# Patient Record
Sex: Male | Born: 1945 | Race: White | Hispanic: No | Marital: Married | Smoking: Former smoker
Health system: Southern US, Community
[De-identification: ages and names within clinical notes are randomized; demographics above are authoritative.]

## PROBLEM LIST (undated history)

## (undated) DIAGNOSIS — M109 Gout, unspecified: Secondary | ICD-10-CM

## (undated) DIAGNOSIS — E663 Overweight: Secondary | ICD-10-CM

## (undated) DIAGNOSIS — N289 Disorder of kidney and ureter, unspecified: Secondary | ICD-10-CM

## (undated) DIAGNOSIS — M549 Dorsalgia, unspecified: Secondary | ICD-10-CM

## (undated) DIAGNOSIS — N183 Chronic kidney disease, stage 3 unspecified: Secondary | ICD-10-CM

## (undated) DIAGNOSIS — I10 Essential (primary) hypertension: Secondary | ICD-10-CM

## (undated) DIAGNOSIS — G4733 Obstructive sleep apnea (adult) (pediatric): Secondary | ICD-10-CM

## (undated) DIAGNOSIS — Z87442 Personal history of urinary calculi: Secondary | ICD-10-CM

## (undated) DIAGNOSIS — E785 Hyperlipidemia, unspecified: Secondary | ICD-10-CM

## (undated) DIAGNOSIS — R7301 Impaired fasting glucose: Secondary | ICD-10-CM

## (undated) HISTORY — DX: Dorsalgia, unspecified: M54.9

## (undated) HISTORY — DX: Obstructive sleep apnea (adult) (pediatric): G47.33

## (undated) HISTORY — DX: Essential (primary) hypertension: I10

## (undated) HISTORY — DX: Impaired fasting glucose: R73.01

## (undated) HISTORY — PX: TONSILECTOMY, ADENOIDECTOMY, BILATERAL MYRINGOTOMY AND TUBES: SHX2538

## (undated) HISTORY — DX: Gout, unspecified: M10.9

## (undated) HISTORY — PX: TOE SURGERY: SHX1073

## (undated) HISTORY — DX: Chronic kidney disease, stage 3 unspecified: N18.30

## (undated) HISTORY — PX: OTHER SURGICAL HISTORY: SHX169

## (undated) HISTORY — DX: Overweight: E66.3

## (undated) HISTORY — DX: Chronic kidney disease, stage 3 (moderate): N18.3

## (undated) HISTORY — DX: Disorder of kidney and ureter, unspecified: N28.9

## (undated) HISTORY — DX: Hyperlipidemia, unspecified: E78.5

---

## 2005-09-27 ENCOUNTER — Inpatient Hospital Stay (HOSPITAL_COMMUNITY): Admission: RE | Admit: 2005-09-27 | Discharge: 2005-09-29 | Payer: Self-pay | Admitting: Urology

## 2005-10-04 ENCOUNTER — Ambulatory Visit: Admission: RE | Admit: 2005-10-04 | Discharge: 2005-10-04 | Payer: Self-pay | Admitting: Urology

## 2007-02-23 IMAGING — RF DG ABDOMEN 1V
1 series · 7 of 7 positions shown · non-contrast
Comparison: none

CLINICAL DATA: Right staghorn calculus. 
 ABDOMEN ? 1 VIEW:

[Series 1: run · 7 of 7 slices shown]
[im 1/7]
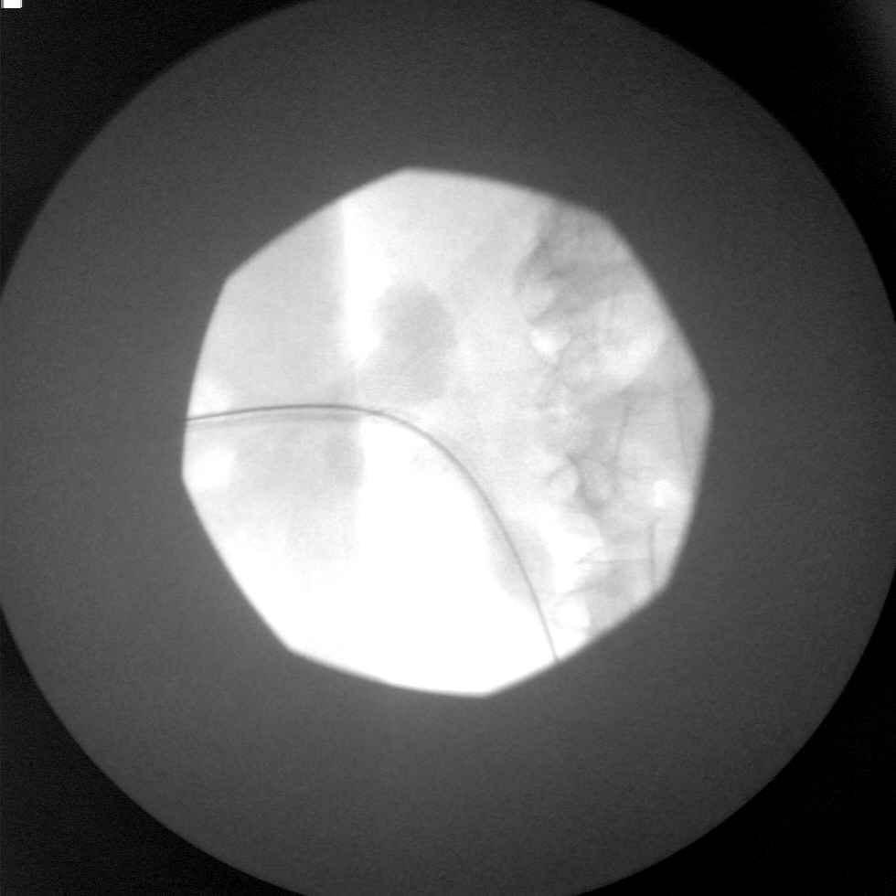
[im 2/7]
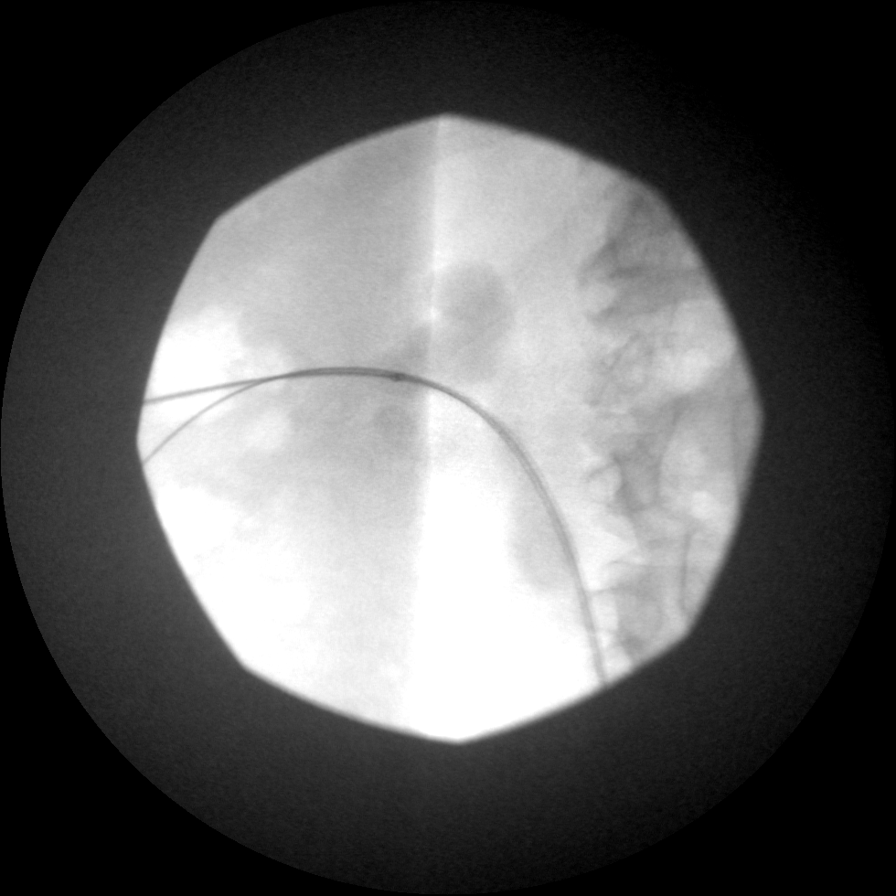
[im 3/7]
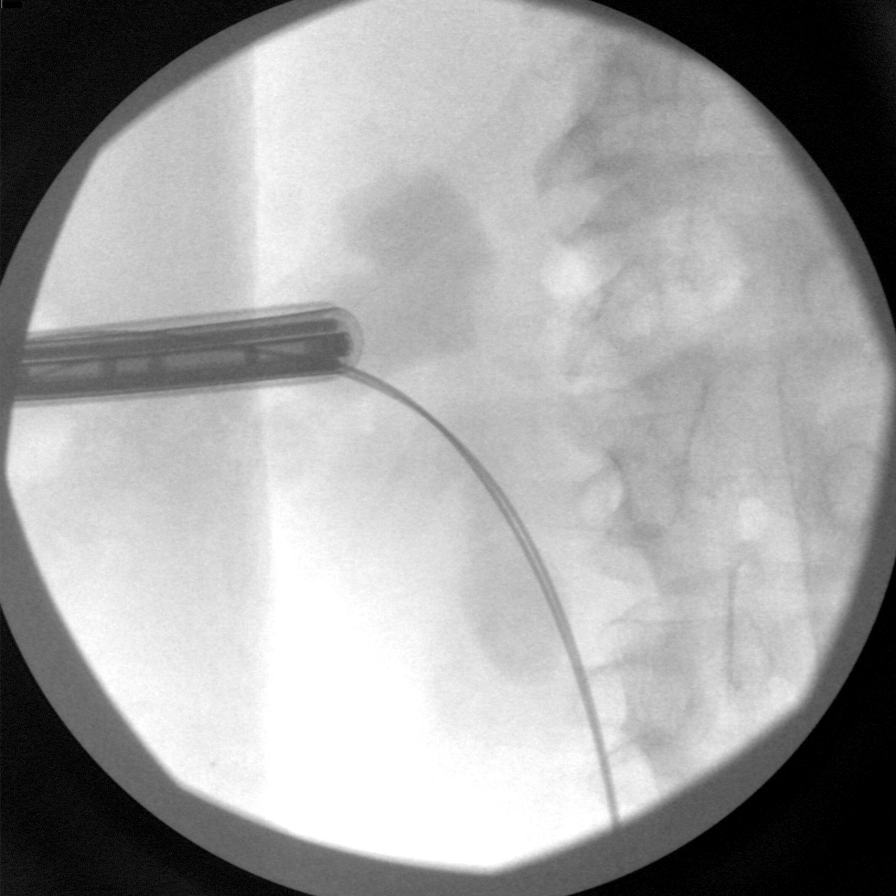
[im 4/7]
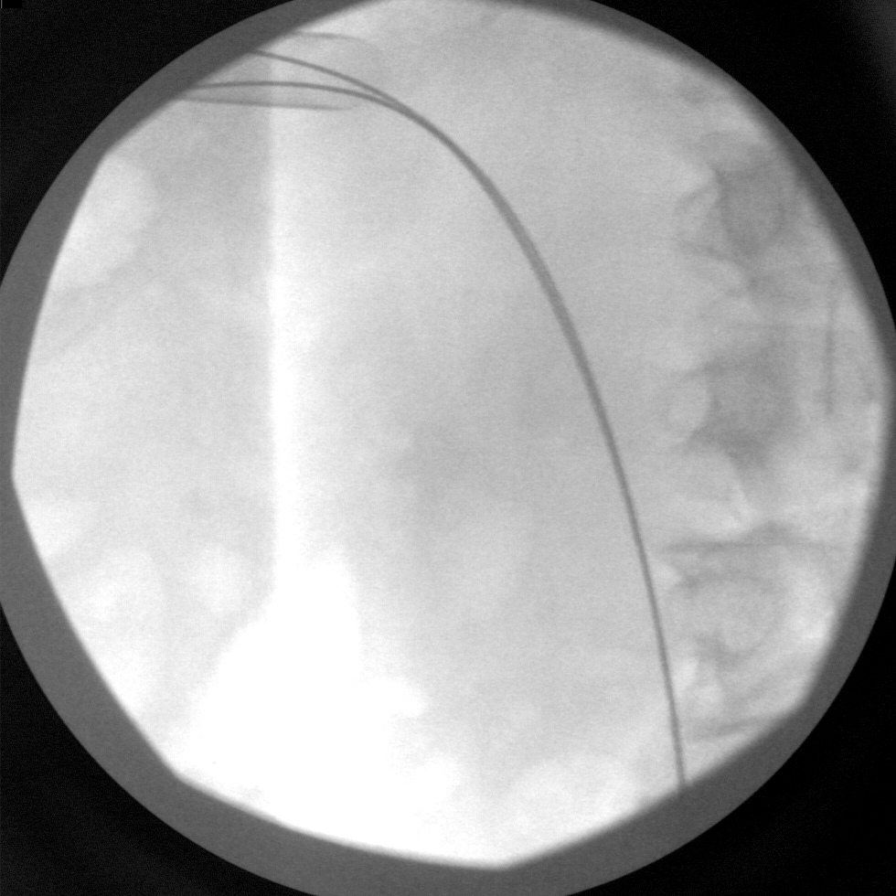
[im 5/7]
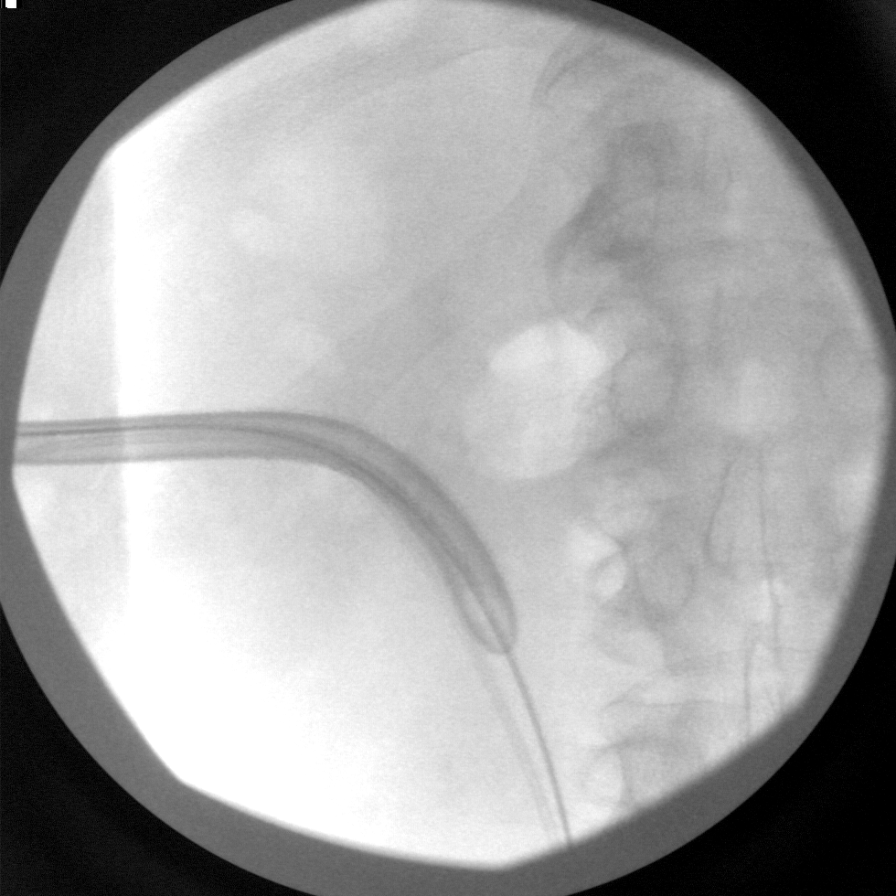
[im 6/7]
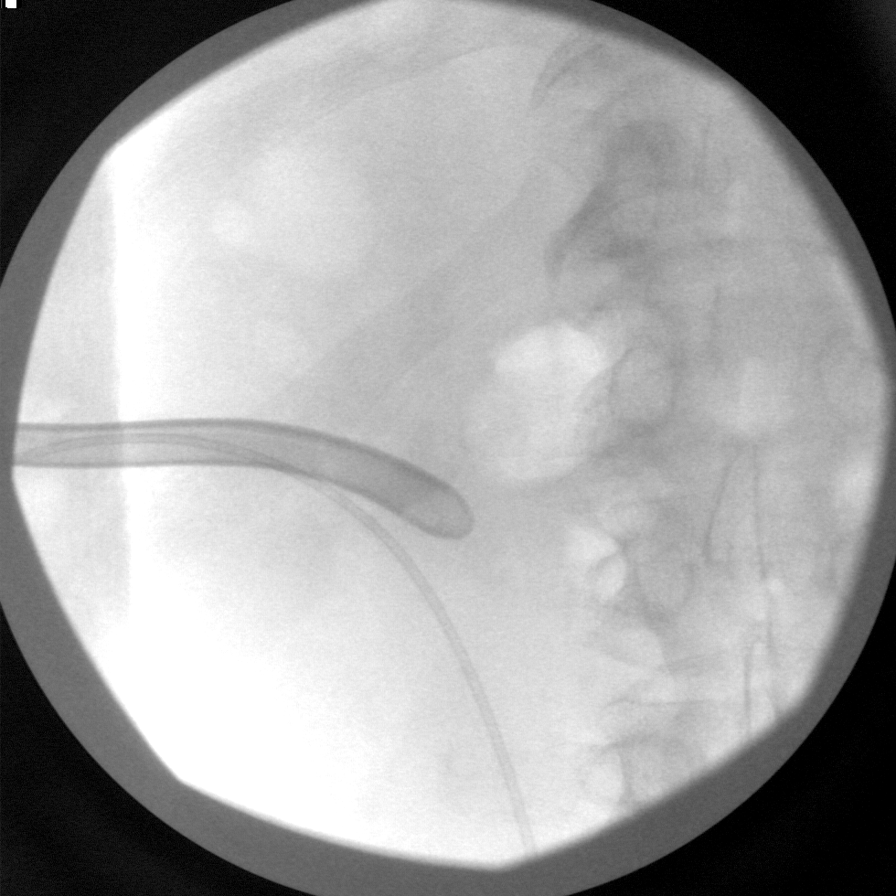
[im 7/7]
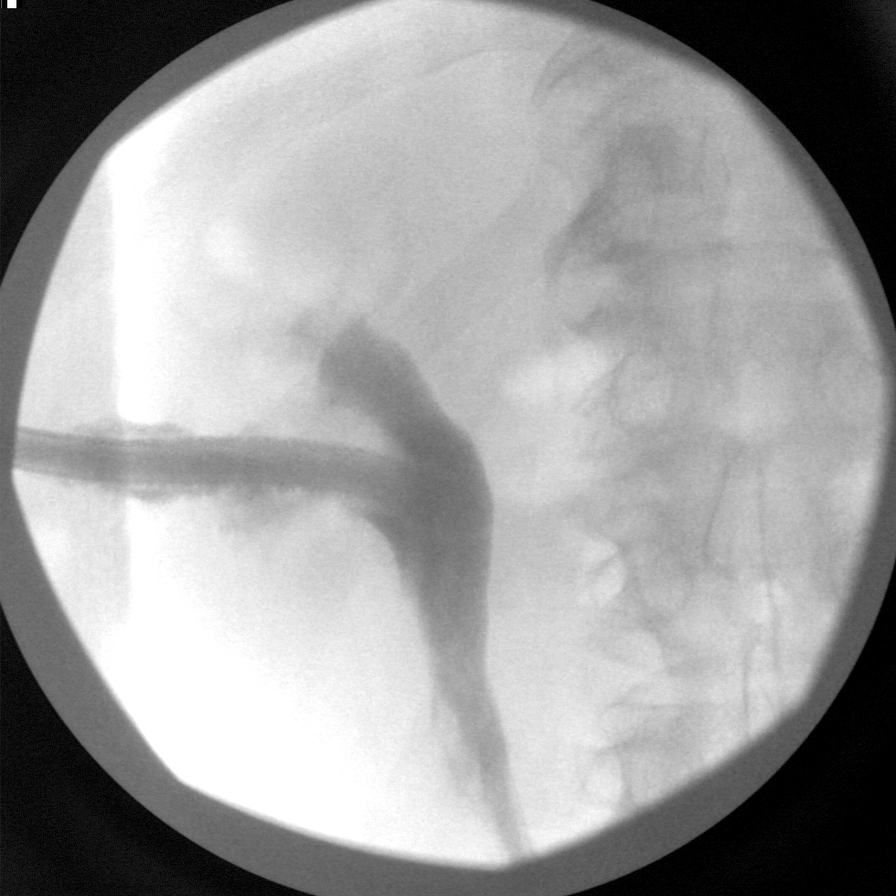

[7 of 7 positions shown; findings below may reference images not displayed]

FINDINGS: A single spot film over the right kidney shows a right percutaneous stent present overlying the previously demonstrated right renal staghorn calculus.
IMPRESSION: See above discussion.

## 2007-02-24 IMAGING — CT CT ABDOMEN W/O CM
2 of 3 series · 16 of 46 positions shown, 18 images · non-contrast
Comparison: none

CLINICAL DATA: Right staghorn calculus, blood in the urine, right renal drain, status post percutaneous nephrolithotomy.
 ABDOMEN CT WITHOUT CONTRAST ? 09/28/05:
TECHNIQUE: Multidetector CT imaging of the abdomen was performed following the standard protocol without IV contrast. 
 Comparing to percutaneous nephrolithotomy images of 09/27/05.
TECHNIQUE: Multidetector CT imaging of the pelvis was performed following the standard protocol without IV contrast.

[Series 2: stone_wo 5.0 b40f st · axial · 0.71mm/px · z∈[-400,-44]mm · 13 of 103 slices shown, 15 images]
[im 7/103  soft-tissue]
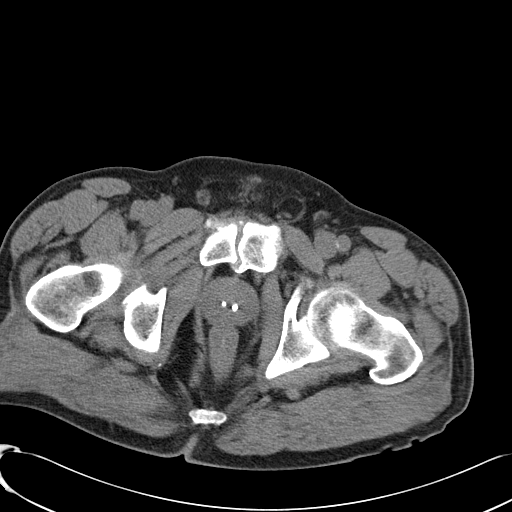
[im 7/103  bone]
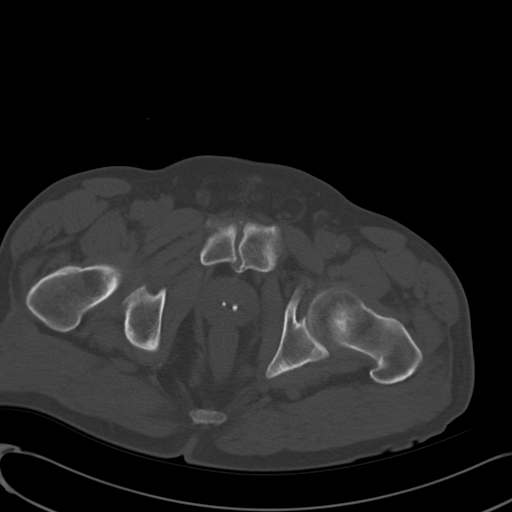
[im 14/103  soft-tissue]
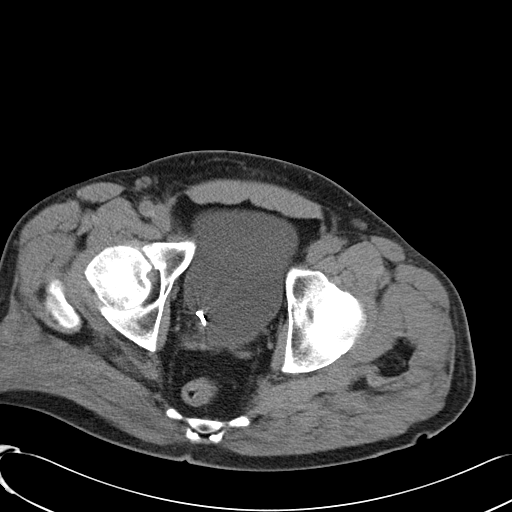
[im 20/103  soft-tissue]
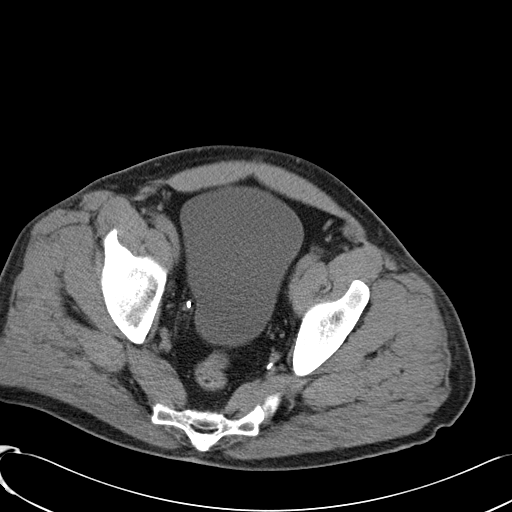
[im 30/103  soft-tissue]
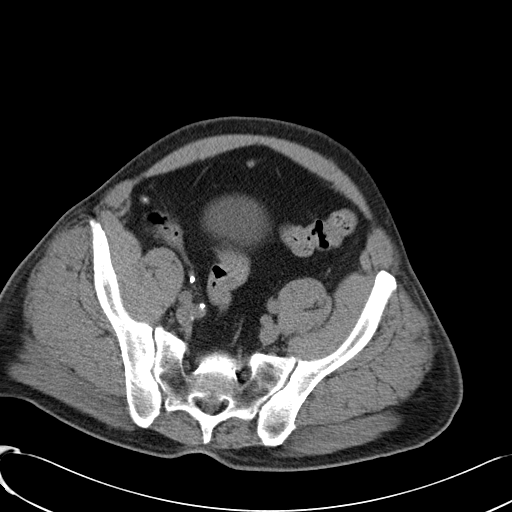
[im 37/103  soft-tissue]
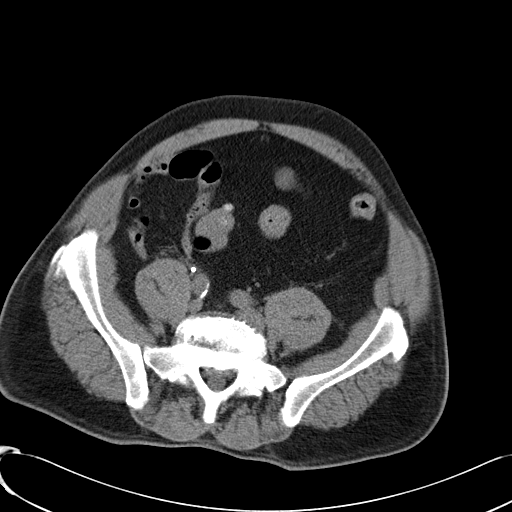
[im 43/103  soft-tissue]
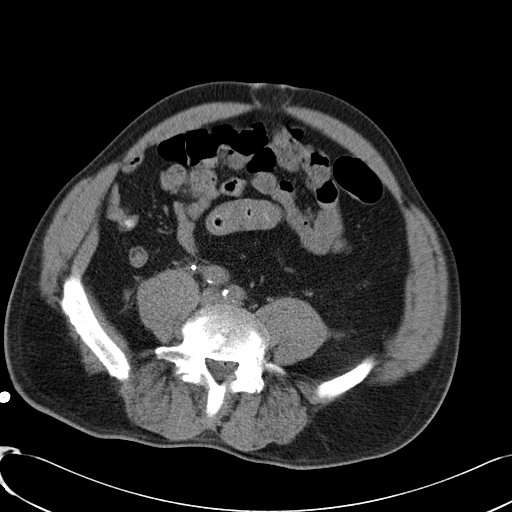
[im 53/103  soft-tissue]
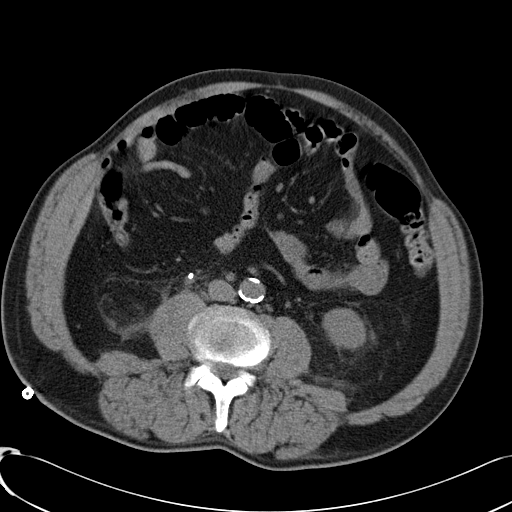
[im 60/103  soft-tissue]
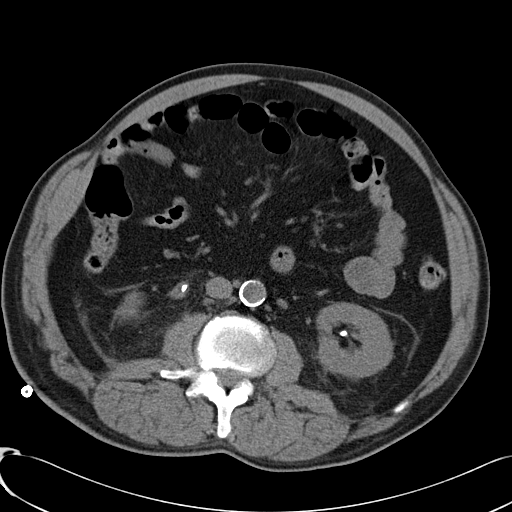
[im 66/103  soft-tissue]
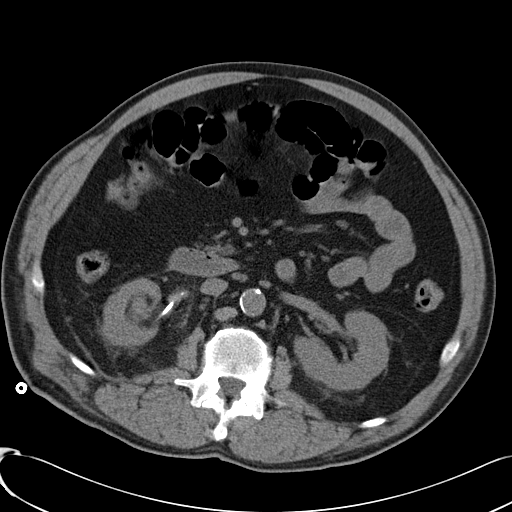
[im 66/103  bone]
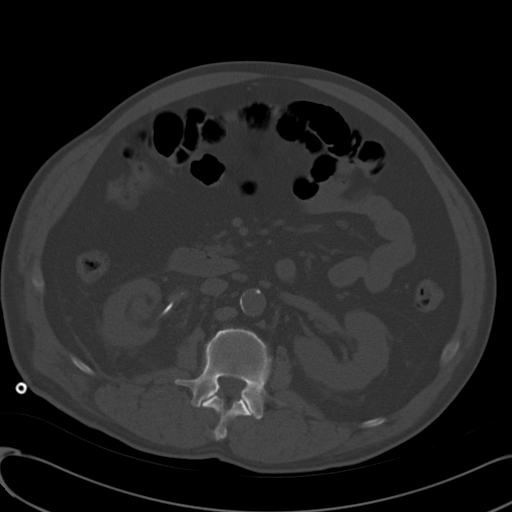
[im 73/103  soft-tissue]
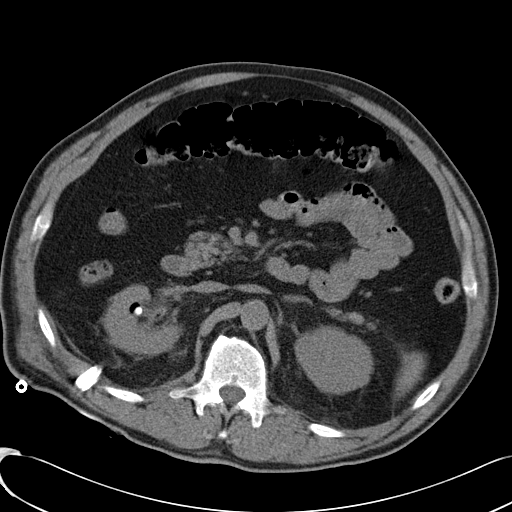
[im 83/103  soft-tissue]
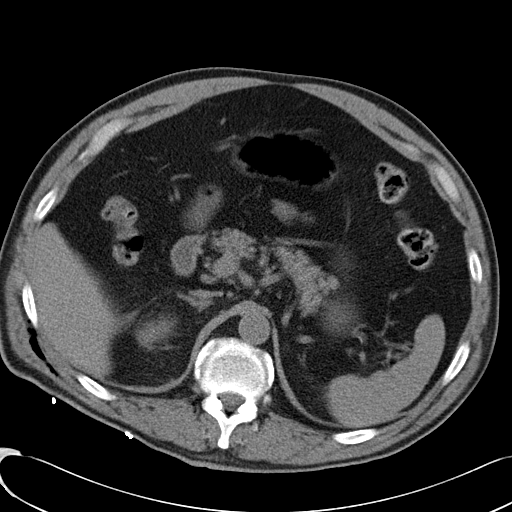
[im 89/103  soft-tissue]
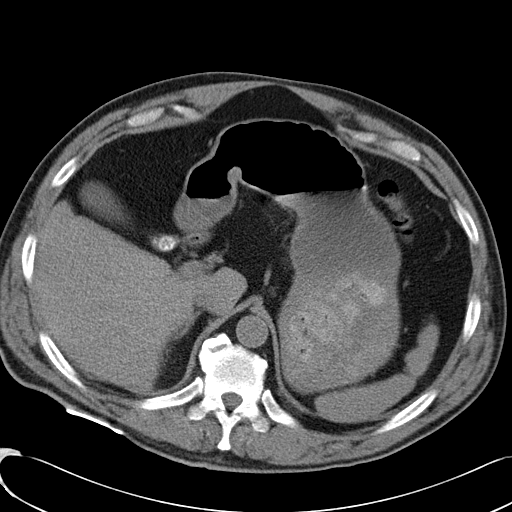
[im 96/103  soft-tissue]
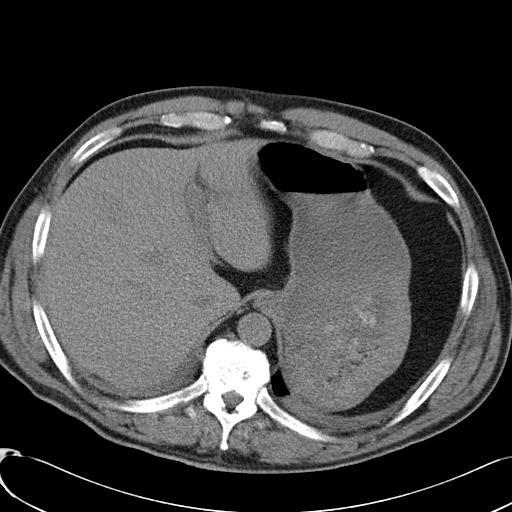

[Series 602: coronal · coronal · 0.83mm/px · 3 of 39 slices shown]
[im 13/39  soft-tissue]
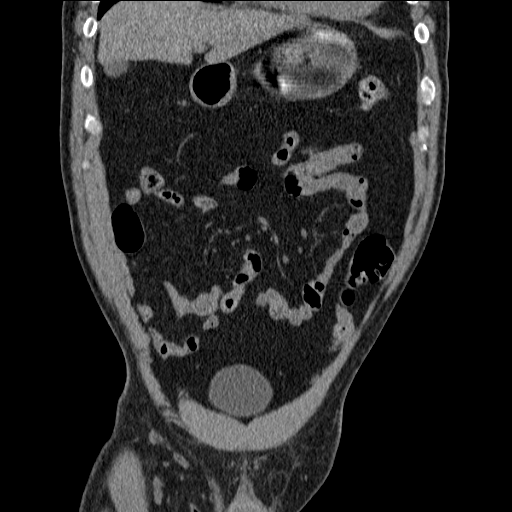
[im 17/39  soft-tissue]
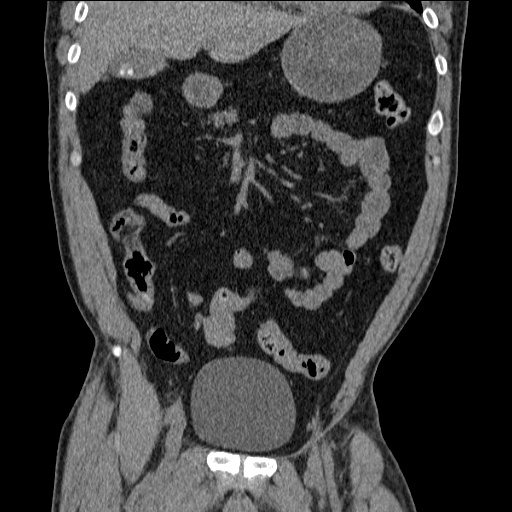
[im 22/39  soft-tissue]
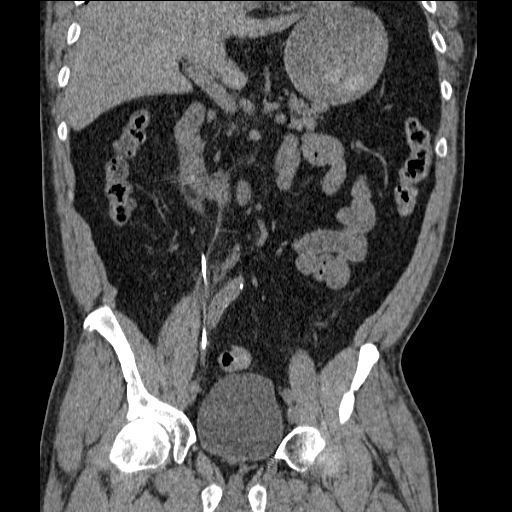

[16 of 46 positions shown; findings below may reference images not displayed]

FINDINGS: There is atelectasis at both lung bases along with a small right pleural effusion.  
 Multiple gallstones are present in the gallbladder.  No biliary duct dilatation.  Noncontrast CT appearance of the spleen, pancreas, and adrenal glands is unremarkable.  
 A right-sided nephrostomy tube is noted extending from a posterolateral approach to terminate in the right collecting system.  Adjacent to this nephrostomy tube, a safety wire is noted extending down in the right ureter to the level of the ureterovesical junction.  There is perirenal stranding but no significant masslike perinephric hematoma.  Gas is present in the collecting system, as expected.  
 In the right mid-kidney as measured on image #30, there is a 6 x 5 mm calculus. Separately, in the right mid-kidney, there is a 1-2 mm calculus anteriorly on image #35 of series 2.  
 Scattered small retroperitoneal lymph nodes are present.  There is periureteral stranding on the right.
 On the left we demonstrated a 7 x 5 mm lower pole calculus, without evidence of left hydronephrosis or hydroureter.  Mild to moderate aortic atherosclerosis is present.
 At the level of the iliac crests incidental note is made of an umbilical hernia containing adipose tissue.
IMPRESSION: 1.  Right nephrostomy tube and safety wire are in place as noted above.  There is right perirenal and periureteral stranding but no significant perinephric hematoma is identified.
 2.  Right mid-kidney calculus and left lower pole calculus.
 3.  Atherosclerosis.
 4.  Mild bibasilar atelectasis and small right pleural effusion.
 5.  Cholelithiasis.
 PELVIS CT WITHOUT CONTRAST ? 09/28/05:
FINDINGS: A right distal ureteral safety catheter extends into the region of the right ureterovesical junction.  The visualized pelvic bowel appears unremarkable other than for mild to moderate sigmoid diverticulosis.  No active diverticulitis.  No free pelvic fluid.  There is are several central calcifications within the prostate gland.  There is some subchondral cyst formation along the acetabular roofs.
IMPRESSION: 1.  A safety catheter is noted in the region of the right ureterovesical junction.
 2.  Mild sigmoid diverticulosis without evidence of active diverticulitis.

## 2017-02-04 ENCOUNTER — Telehealth: Payer: Self-pay

## 2017-02-04 NOTE — Telephone Encounter (Signed)
[phone] PATIENT RECEIVED LETTER TO SCHEDULE TCS

## 2017-02-08 ENCOUNTER — Telehealth: Payer: Self-pay

## 2017-02-08 NOTE — Telephone Encounter (Signed)
Pt was calling to follow up with DS with his triage. I told him that DS said she would call him Monday morning. Pt agreed. [phone]

## 2017-02-13 NOTE — Telephone Encounter (Signed)
See separate note.

## 2017-02-13 NOTE — Telephone Encounter (Signed)
PT had previous colonoscopies done by Dr. West Carbo. He is going to have those reports faxed to me to review and see when he is due. He is not having any problems. Said he remembers he did have something suspicious once but the last time he thinks everything was OK. I will call pt back when I receive those records. He is out of town and it will be next week before he can get them faxed to me.

## 2017-02-21 NOTE — Telephone Encounter (Signed)
Pt has been scheduled an OV with Walden Field, NP on 03/14/2017 at 11:00 AM due to hx of polyps, hx of previous problems with colonoscopy and had to have propofol. ( See notes in referral).   Pt states he has Stage 3 Kidney disease and is concerned about his prep also, and I assured him that we are familiar with scheduling with kidney disease.

## 2017-03-14 ENCOUNTER — Encounter: Payer: Self-pay | Admitting: Nurse Practitioner

## 2017-03-14 ENCOUNTER — Ambulatory Visit (INDEPENDENT_AMBULATORY_CARE_PROVIDER_SITE_OTHER): Payer: Medicare Other | Admitting: Nurse Practitioner

## 2017-03-14 ENCOUNTER — Telehealth: Payer: Self-pay

## 2017-03-14 ENCOUNTER — Other Ambulatory Visit: Payer: Self-pay

## 2017-03-14 DIAGNOSIS — Z86010 Personal history of colonic polyps: Secondary | ICD-10-CM

## 2017-03-14 MED ORDER — PEG 3350-KCL-NA BICARB-NACL 420 G PO SOLR: 4000.0000 mL | ORAL | 0 refills | Status: AC

## 2017-03-14 NOTE — Telephone Encounter (Signed)
Called and informed pt of pre-op appt 05/02/17 at 10:00am. Letter also mailed.

## 2017-03-14 NOTE — Assessment & Plan Note (Signed)
Per previous colonoscopy report by Dr. West Carbo in [place], [place] the patient had a polyp in 2009 on colonoscopy with severe atypia. Repeat colonoscopy in 2010 without polyps. Recommended 5 year repeat exam and he is currently 3 years overdue. He is generally asymptomatic from a GI standpoint. He does have sleep apnea and previously his procedures have been done on propofol because of his sleep apnea. We will proceed with colonoscopy as previous he recommended.  Proceed with TCS with Dr. Gala Romney in near future: the risks, benefits, and alternatives have been discussed with the patient in detail. The patient states understanding and desires to proceed.  The patient drinks 2 drinks a day. Denies recreational drugs. No other anticoagulants, anxiolytics, chronic pain medications, or antidepressants. We will proceed with propofol/MAC to promote adequate sedation as has been done in the past with good result.  The patient notes he has stable stage III kidney disease. GoLYTELY prep typically works well in this case. We will plan for tap water enema rather than a fleets enema.

## 2017-03-14 NOTE — Patient Instructions (Signed)
1. We will schedule your procedure for you. 2. Further recommendations to be made based on results your procedure. 3. Repeat colonoscopy based on the results of your procedure. 4. Call if you have any questions or concerns prior to your procedure. 5. Return for follow-up based on recommendations made after your colonoscopy.

## 2017-03-14 NOTE — Progress Notes (Addendum)
Primary Care Physician:  Earney Mallet, MD Primary Gastroenterologist:  Dr. Gala Romney  Chief Complaint  Patient presents with  . Colonoscopy    HPI:   Erik Gonzales is a 71 y.o. male who presents On referral from primary care for colonoscopy. Per the chief complaint patient with a history of problems due to sleep apnea and has previously been done on propofol/monitored anesthesia care. PCP notes reviewed. Previous GI notes reviewed. Last colonoscopy 04/11/2009 which found no recurrent polypoid disease. Does have a history of polyps. Recommended 5 year repeat colonoscopy and he is currently 3 years overdue. GI Notes makes sigmoid polyp removed in 2009 with severe atypia.  Today he states he's doing well overall. He states he has not had a problem with colonoscopy in the past that he knows of. States he's overdue for colonoscopy. Agrees he's had polyps, but not with his last colonoscopy. Has sleep apnea. Denies abdominal pain, N/V, hematochezia, melena, fever, chills, unintentional weight loss, acute changes in bowel habits/consistency/caliber. Denies chest pain, dyspnea, dizziness, lightheadedness, syncope, near syncope. Denies any other upper or lower GI symptoms.  Uses CPAP at home for sleep apnea. He does have stable stage 3 chronic kidney disease.  Past Medical History:  Diagnosis Date  . Kidney disease     No past surgical history on file.  Current Outpatient Prescriptions  Medication Sig Dispense Refill  . amLODipine-benazepril (LOTREL) 10-20 MG capsule     . atorvastatin (LIPITOR) 40 MG tablet     . Cholecalciferol (VITAMIN D3) 1000 units CAPS Take 1,000 Units by mouth daily.    . Multiple Vitamin (MULTIVITAMIN) capsule Take 1 capsule by mouth daily.    Marland Kitchen ULORIC 40 MG tablet      No current facility-administered medications for this visit.     Allergies as of 03/14/2017  . (No Known Allergies)    Family History  Problem Relation Age of Onset  . Colon cancer Neg Hx   .  Colon polyps Neg Hx     Social History   Social History  . Marital status: Unknown    Spouse name: N/A  . Number of children: N/A  . Years of education: N/A   Occupational History  . Not on file.   Social History Main Topics  . Smoking status: Never Smoker  . Smokeless tobacco: Never Used  . Alcohol use Yes     Comment: 2 drink a day  . Drug use: No  . Sexual activity: Not on file   Other Topics Concern  . Not on file   Social History Narrative  . No narrative on file    Review of Systems: General: Negative for anorexia, weight loss, fever, chills, fatigue, weakness. Eyes: Negative for vision changes.  ENT: Negative for hoarseness, difficulty swallowing , nasal congestion. CV: Negative for chest pain, angina, palpitations, dyspnea on exertion, peripheral edema.  Respiratory: Negative for dyspnea at rest, dyspnea on exertion, cough, sputum, wheezing.  GI: See history of present illness. GU:  Negative for dysuria, hematuria, urinary incontinence, urinary frequency, nocturnal urination.  MS: Negative for joint pain, low back pain.  Derm: Negative for rash or itching.  Neuro: Negative for weakness, abnormal sensation, seizure, frequent headaches, memory loss, confusion.  Psych: Negative for anxiety, depression, suicidal ideation, hallucinations.  Endo: Negative for unusual weight change.  Heme: Negative for bruising or bleeding. Allergy: Negative for rash or hives.    Physical Exam: BP (!) 143/69   Pulse 65   Temp 98.3 F (  36.8 C) (Oral)   Ht 5\' 4"  (1.626 m)   Wt 168 lb 12.8 oz (76.6 kg)   BMI 28.97 kg/m  General:   Alert and oriented. Pleasant and cooperative. Well-nourished and well-developed.  Head:  Normocephalic and atraumatic. Eyes:  Without icterus, sclera clear and conjunctiva pink.  Ears:  Normal auditory acuity. Mouth:  No deformity or lesions, oral mucosa pink.  Throat/Neck:  Supple, without mass or thyromegaly. Cardiovascular:  S1, S2 present  without murmurs appreciated. Normal pulses noted. Extremities without clubbing or edema. Respiratory:  Clear to auscultation bilaterally. No wheezes, rales, or rhonchi. No distress.  Gastrointestinal:  +BS, soft, non-tender and non-distended. No HSM noted. No guarding or rebound. No masses appreciated.  Rectal:  Deferred  Musculoskalatal:  Symmetrical without gross deformities. Normal posture. Skin:  Intact without significant lesions or rashes. Neurologic:  Alert and oriented x4;  grossly normal neurologically. Psych:  Alert and cooperative. Normal mood and affect. Heme/Lymph/Immune: No significant cervical adenopathy. No excessive bruising noted.    03/14/2017 11:49 AM   Disclaimer: This note was dictated with voice recognition software. Similar sounding words can inadvertently be transcribed and may not be corrected upon review.

## 2017-03-18 NOTE — Progress Notes (Signed)
cc'd to pcp 

## 2017-04-24 NOTE — Patient Instructions (Signed)
Erik Gonzales  04/24/2017     @PREFPERIOPPHARMACY @   Your procedure is scheduled on  05/06/2017 .  Report to [facility] at  900  A.M.  Call this number if you have problems the morning of surgery:  [phone]   Remember:  Do not eat food or drink liquids after midnight.  Take these medicines the morning of surgery with A SIP OF WATER  Amlodipine, uloric.   Do not wear jewelry, make-up or nail polish.  Do not wear lotions, powders, or perfumes, or deoderant.  Do not shave 48 hours prior to surgery.  Men may shave face and neck.  Do not bring valuables to the hospital.  [facility] is not responsible for any belongings or valuables.  Contacts, dentures or bridgework may not be worn into surgery.  Leave your suitcase in the car.  After surgery it may be brought to your room.  For patients admitted to the hospital, discharge time will be determined by your treatment team.  Patients discharged the day of surgery will not be allowed to drive home.   Name and phone number of your driver:   family Special instructions:  Follow the diet and prep instructions given to you by Dr Roseanne Kaufman office.  Please read over the following fact sheets that you were given. Anesthesia Post-op Instructions and Care and Recovery After Surgery       Colonoscopy, Adult A colonoscopy is an exam to look at the entire large intestine. During the exam, a lubricated, bendable tube is inserted into the anus and then passed into the rectum, colon, and other parts of the large intestine. A colonoscopy is often done as a part of normal colorectal screening or in response to certain symptoms, such as anemia, persistent diarrhea, abdominal pain, and blood in the stool. The exam can help screen for and diagnose medical problems, including:  Tumors.  Polyps.  Inflammation.  Areas of bleeding.  Tell a health care provider about:  Any allergies you have.  All medicines you are taking,  including vitamins, herbs, eye drops, creams, and over-the-counter medicines.  Any problems you or family members have had with anesthetic medicines.  Any blood disorders you have.  Any surgeries you have had.  Any medical conditions you have.  Any problems you have had passing stool. What are the risks? Generally, this is a safe procedure. However, problems may occur, including:  Bleeding.  A tear in the intestine.  A reaction to medicines given during the exam.  Infection (rare).  What happens before the procedure? Eating and drinking restrictions Follow instructions from your health care provider about eating and drinking, which may include:  A few days before the procedure - follow a low-fiber diet. Avoid nuts, seeds, dried fruit, raw fruits, and vegetables.  1-3 days before the procedure - follow a clear liquid diet. Drink only clear liquids, such as clear broth or bouillon, black coffee or tea, clear juice, clear soft drinks or sports drinks, gelatin dessert, and popsicles. Avoid any liquids that contain red or purple dye.  On the day of the procedure - do not eat or drink anything during the 2 hours before the procedure, or within the time period that your health care provider recommends.  Bowel prep If you were prescribed an oral bowel prep to clean out your colon:  Take it as told by your health care provider. Starting the day before your procedure, you will  need to drink a large amount of medicated liquid. The liquid will cause you to have multiple loose stools until your stool is almost clear or light green.  If your skin or anus gets irritated from diarrhea, you may use these to relieve the irritation: ? Medicated wipes, such as adult wet wipes with aloe and vitamin E. ? A skin soothing-product like petroleum jelly.  If you vomit while drinking the bowel prep, take a break for up to 60 minutes and then begin the bowel prep again. If vomiting continues and you  cannot take the bowel prep without vomiting, call your health care provider.  General instructions  Ask your health care provider about changing or stopping your regular medicines. This is especially important if you are taking diabetes medicines or blood thinners.  Plan to have someone take you home from the hospital or clinic. What happens during the procedure?  An IV tube may be inserted into one of your veins.  You will be given medicine to help you relax (sedative).  To reduce your risk of infection: ? Your health care team will wash or sanitize their hands. ? Your anal area will be washed with soap.  You will be asked to lie on your side with your knees bent.  Your health care provider will lubricate a long, thin, flexible tube. The tube will have a camera and a light on the end.  The tube will be inserted into your anus.  The tube will be gently eased through your rectum and colon.  Air will be delivered into your colon to keep it open. You may feel some pressure or cramping.  The camera will be used to take images during the procedure.  A small tissue sample may be removed from your body to be examined under a microscope (biopsy). If any potential problems are found, the tissue will be sent to a lab for testing.  If small polyps are found, your health care provider may remove them and have them checked for cancer cells.  The tube that was inserted into your anus will be slowly removed. The procedure may vary among health care providers and hospitals. What happens after the procedure?  Your blood pressure, heart rate, breathing rate, and blood oxygen level will be monitored until the medicines you were given have worn off.  Do not drive for 24 hours after the exam.  You may have a small amount of blood in your stool.  You may pass gas and have mild abdominal cramping or bloating due to the air that was used to inflate your colon during the exam.  It is up to you to  get the results of your procedure. Ask your health care provider, or the department performing the procedure, when your results will be ready. This information is not intended to replace advice given to you by your health care provider. Make sure you discuss any questions you have with your health care provider. Document Released: 10/12/2000 Document Revised: 08/15/2016 Document Reviewed: 12/27/2015 Elsevier Interactive Patient Education  2018 [employer].  Colonoscopy, Adult, Care After This sheet gives you information about how to care for yourself after your procedure. Your health care provider may also give you more specific instructions. If you have problems or questions, contact your health care provider. What can I expect after the procedure? After the procedure, it is common to have:  A small amount of blood in your stool for 24 hours after the procedure.  Some gas.  Mild abdominal cramping or bloating.  Follow these instructions at home: General instructions   For the first 24 hours after the procedure: ? Do not drive or use machinery. ? Do not sign important documents. ? Do not drink alcohol. ? Do your regular daily activities at a slower pace than normal. ? Eat soft, easy-to-digest foods. ? Rest often.  Take over-the-counter or prescription medicines only as told by your health care provider.  It is up to you to get the results of your procedure. Ask your health care provider, or the department performing the procedure, when your results will be ready. Relieving cramping and bloating  Try walking around when you have cramps or feel bloated.  Apply heat to your abdomen as told by your health care provider. Use a heat source that your health care provider recommends, such as a moist heat pack or a heating pad. ? Place a towel between your skin and the heat source. ? Leave the heat on for 20-30 minutes. ? Remove the heat if your skin turns bright red. This is  especially important if you are unable to feel pain, heat, or cold. You may have a greater risk of getting burned. Eating and drinking  Drink enough fluid to keep your urine clear or pale yellow.  Resume your normal diet as instructed by your health care provider. Avoid heavy or fried foods that are hard to digest.  Avoid drinking alcohol for as long as instructed by your health care provider. Contact a health care provider if:  You have blood in your stool 2-3 days after the procedure. Get help right away if:  You have more than a small spotting of blood in your stool.  You pass large blood clots in your stool.  Your abdomen is swollen.  You have nausea or vomiting.  You have a fever.  You have increasing abdominal pain that is not relieved with medicine. This information is not intended to replace advice given to you by your health care provider. Make sure you discuss any questions you have with your health care provider. Document Released: 05/29/2004 Document Revised: 07/09/2016 Document Reviewed: 12/27/2015 Elsevier Interactive Patient Education  2018 [place] Anesthesia is a term that refers to techniques, procedures, and medicines that help a person stay safe and comfortable during a medical procedure. Monitored anesthesia care, or sedation, is one type of anesthesia. Your anesthesia specialist may recommend sedation if you will be having a procedure that does not require you to be unconscious, such as:  Cataract surgery.  A dental procedure.  A biopsy.  A colonoscopy.  During the procedure, you may receive a medicine to help you relax (sedative). There are three levels of sedation:  Mild sedation. At this level, you may feel awake and relaxed. You will be able to follow directions.  Moderate sedation. At this level, you will be sleepy. You may not remember the procedure.  Deep sedation. At this level, you will be asleep. You will  not remember the procedure.  The more medicine you are given, the deeper your level of sedation will be. Depending on how you respond to the procedure, the anesthesia specialist may change your level of sedation or the type of anesthesia to fit your needs. An anesthesia specialist will monitor you closely during the procedure. Let your health care provider know about:  Any allergies you have.  All medicines you are taking, including vitamins, herbs, eye drops, creams, and over-the-counter medicines.  Any use of steroids (by mouth or as a cream).  Any problems you or family members have had with sedatives and anesthetic medicines.  Any blood disorders you have.  Any surgeries you have had.  Any medical conditions you have, such as sleep apnea.  Whether you are pregnant or may be pregnant.  Any use of cigarettes, alcohol, or street drugs. What are the risks? Generally, this is a safe procedure. However, problems may occur, including:  Getting too much medicine (oversedation).  Nausea.  Allergic reaction to medicines.  Trouble breathing. If this happens, a breathing tube may be used to help with breathing. It will be removed when you are awake and breathing on your own.  Heart trouble.  Lung trouble.  Before the procedure Staying hydrated Follow instructions from your health care provider about hydration, which may include:  Up to 2 hours before the procedure - you may continue to drink clear liquids, such as water, clear fruit juice, black coffee, and plain tea.  Eating and drinking restrictions Follow instructions from your health care provider about eating and drinking, which may include:  8 hours before the procedure - stop eating heavy meals or foods such as meat, fried foods, or fatty foods.  6 hours before the procedure - stop eating light meals or foods, such as toast or cereal.  6 hours before the procedure - stop drinking milk or drinks that contain milk.  2  hours before the procedure - stop drinking clear liquids.  Medicines Ask your health care provider about:  Changing or stopping your regular medicines. This is especially important if you are taking diabetes medicines or blood thinners.  Taking medicines such as aspirin and ibuprofen. These medicines can thin your blood. Do not take these medicines before your procedure if your health care provider instructs you not to.  Tests and exams  You will have a physical exam.  You may have blood tests done to show: ? How well your kidneys and liver are working. ? How well your blood can clot.  General instructions  Plan to have someone take you home from the hospital or clinic.  If you will be going home right after the procedure, plan to have someone with you for 24 hours.  What happens during the procedure?  Your blood pressure, heart rate, breathing, level of pain and overall condition will be monitored.  An IV tube will be inserted into one of your veins.  Your anesthesia specialist will give you medicines as needed to keep you comfortable during the procedure. This may mean changing the level of sedation.  The procedure will be performed. After the procedure  Your blood pressure, heart rate, breathing rate, and blood oxygen level will be monitored until the medicines you were given have worn off.  Do not drive for 24 hours if you received a sedative.  You may: ? Feel sleepy, clumsy, or nauseous. ? Feel forgetful about what happened after the procedure. ? Have a sore throat if you had a breathing tube during the procedure. ? Vomit. This information is not intended to replace advice given to you by your health care provider. Make sure you discuss any questions you have with your health care provider. Document Released: 07/11/2005 Document Revised: 03/23/2016 Document Reviewed: 02/05/2016 Elsevier Interactive Patient Education  2018 [place],  Care After These instructions provide you with information about caring for yourself after your procedure. Your health care provider may also give you more  specific instructions. Your treatment has been planned according to current medical practices, but problems sometimes occur. Call your health care provider if you have any problems or questions after your procedure. What can I expect after the procedure? After your procedure, it is common to:  Feel sleepy for several hours.  Feel clumsy and have poor balance for several hours.  Feel forgetful about what happened after the procedure.  Have poor judgment for several hours.  Feel nauseous or vomit.  Have a sore throat if you had a breathing tube during the procedure.  Follow these instructions at home: For at least 24 hours after the procedure:   Do not: ? Participate in activities in which you could fall or become injured. ? Drive. ? Use heavy machinery. ? Drink alcohol. ? Take sleeping pills or medicines that cause drowsiness. ? Make important decisions or sign legal documents. ? Take care of children on your own.  Rest. Eating and drinking  Follow the diet that is recommended by your health care provider.  If you vomit, drink water, juice, or soup when you can drink without vomiting.  Make sure you have little or no nausea before eating solid foods. General instructions  Have a responsible adult stay with you until you are awake and alert.  Take over-the-counter and prescription medicines only as told by your health care provider.  If you smoke, do not smoke without supervision.  Keep all follow-up visits as told by your health care provider. This is important. Contact a health care provider if:  You keep feeling nauseous or you keep vomiting.  You feel light-headed.  You develop a rash.  You have a fever. Get help right away if:  You have trouble breathing. This information is not intended to replace  advice given to you by your health care provider. Make sure you discuss any questions you have with your health care provider. Document Released: 02/05/2016 Document Revised: 06/06/2016 Document Reviewed: 02/05/2016 Elsevier Interactive Patient Education  [employer].

## 2017-05-02 ENCOUNTER — Encounter (HOSPITAL_COMMUNITY)
Admission: RE | Admit: 2017-05-02 | Discharge: 2017-05-02 | Disposition: A | Discharge status: Home / Self Care | Payer: Medicare Other | Source: Ambulatory Visit | Attending: Internal Medicine | Admitting: Internal Medicine

## 2017-05-02 ENCOUNTER — Inpatient Hospital Stay (HOSPITAL_COMMUNITY)
Admission: RE | Admit: 2017-05-02 | Discharge: 2017-05-02 | Disposition: A | Discharge status: Home / Self Care | Payer: Medicare Other | Source: Ambulatory Visit

## 2017-05-02 ENCOUNTER — Encounter (HOSPITAL_COMMUNITY): Payer: Self-pay

## 2017-05-02 DIAGNOSIS — Z01818 Encounter for other preprocedural examination: Secondary | ICD-10-CM | POA: Insufficient documentation

## 2017-05-02 DIAGNOSIS — Z86010 Personal history of colonic polyps: Secondary | ICD-10-CM | POA: Diagnosis not present

## 2017-05-02 DIAGNOSIS — Z01812 Encounter for preprocedural laboratory examination: Secondary | ICD-10-CM | POA: Insufficient documentation

## 2017-05-02 HISTORY — DX: Personal history of urinary calculi: Z87.442

## 2017-05-02 LAB — CBC WITH DIFFERENTIAL/PLATELET
Basophils Absolute: 0 10*3/uL (ref 0.0–0.1)
Basophils Relative: 1 %
Eosinophils Absolute: 0.2 10*3/uL (ref 0.0–0.7)
Eosinophils Relative: 3 %
HEMATOCRIT: 35.8 % — AB (ref 39.0–52.0)
HEMOGLOBIN: 12.4 g/dL — AB (ref 13.0–17.0)
Lymphocytes Relative: 27 %
Lymphs Abs: 1.6 10*3/uL (ref 0.7–4.0)
MCH: 32.6 pg (ref 26.0–34.0)
MCHC: 34.6 g/dL (ref 30.0–36.0)
MCV: 94.2 fL (ref 78.0–100.0)
Monocytes Absolute: 0.6 10*3/uL (ref 0.1–1.0)
Monocytes Relative: 10 %
Neutro Abs: 3.4 10*3/uL (ref 1.7–7.7)
Neutrophils Relative %: 59 %
Platelets: 269 10*3/uL (ref 150–400)
RBC: 3.8 MIL/uL — ABNORMAL LOW (ref 4.22–5.81)
RDW: 11.9 % (ref 11.5–15.5)
WBC: 5.8 10*3/uL (ref 4.0–10.5)

## 2017-05-02 LAB — BASIC METABOLIC PANEL
ANION GAP: 8 (ref 5–15)
BUN: 33 mg/dL — ABNORMAL HIGH (ref 6–20)
CHLORIDE: 99 mmol/L — AB (ref 101–111)
CO2: 25 mmol/L (ref 22–32)
Calcium: 9 mg/dL (ref 8.9–10.3)
Creatinine, Ser: 1.59 mg/dL — ABNORMAL HIGH (ref 0.61–1.24)
GFR calc Af Amer: 49 mL/min — ABNORMAL LOW (ref >60)
GFR calc non Af Amer: 43 mL/min — ABNORMAL LOW (ref >60)
GLUCOSE: 113 mg/dL — AB (ref 65–99)
Potassium: 4.3 mmol/L (ref 3.5–5.1)
Sodium: 132 mmol/L — ABNORMAL LOW (ref 135–145)

## 2017-05-06 ENCOUNTER — Ambulatory Visit (HOSPITAL_COMMUNITY): Payer: Medicare Other | Admitting: Anesthesiology

## 2017-05-06 ENCOUNTER — Ambulatory Visit (HOSPITAL_COMMUNITY)
Admission: RE | Admit: 2017-05-06 | Discharge: 2017-05-06 | Disposition: A | Discharge status: Home / Self Care | Payer: Medicare Other | Source: Ambulatory Visit | Attending: Internal Medicine | Admitting: Internal Medicine

## 2017-05-06 ENCOUNTER — Encounter (HOSPITAL_COMMUNITY): Payer: Self-pay

## 2017-05-06 ENCOUNTER — Encounter (HOSPITAL_COMMUNITY)
Admission: RE | Disposition: A | Discharge status: Home / Self Care | Payer: Self-pay | Source: Ambulatory Visit | Attending: Internal Medicine

## 2017-05-06 DIAGNOSIS — D127 Benign neoplasm of rectosigmoid junction: Secondary | ICD-10-CM | POA: Diagnosis not present

## 2017-05-06 DIAGNOSIS — M109 Gout, unspecified: Secondary | ICD-10-CM | POA: Insufficient documentation

## 2017-05-06 DIAGNOSIS — Z1211 Encounter for screening for malignant neoplasm of colon: Secondary | ICD-10-CM | POA: Diagnosis present

## 2017-05-06 DIAGNOSIS — I129 Hypertensive chronic kidney disease with stage 1 through stage 4 chronic kidney disease, or unspecified chronic kidney disease: Secondary | ICD-10-CM | POA: Insufficient documentation

## 2017-05-06 DIAGNOSIS — Z86010 Personal history of colonic polyps: Secondary | ICD-10-CM | POA: Diagnosis not present

## 2017-05-06 DIAGNOSIS — Z79899 Other long term (current) drug therapy: Secondary | ICD-10-CM | POA: Insufficient documentation

## 2017-05-06 DIAGNOSIS — G4733 Obstructive sleep apnea (adult) (pediatric): Secondary | ICD-10-CM | POA: Insufficient documentation

## 2017-05-06 DIAGNOSIS — E785 Hyperlipidemia, unspecified: Secondary | ICD-10-CM | POA: Insufficient documentation

## 2017-05-06 DIAGNOSIS — N183 Chronic kidney disease, stage 3 (moderate): Secondary | ICD-10-CM | POA: Diagnosis not present

## 2017-05-06 DIAGNOSIS — Z87442 Personal history of urinary calculi: Secondary | ICD-10-CM | POA: Diagnosis not present

## 2017-05-06 DIAGNOSIS — Z87891 Personal history of nicotine dependence: Secondary | ICD-10-CM | POA: Insufficient documentation

## 2017-05-06 DIAGNOSIS — K5730 Diverticulosis of large intestine without perforation or abscess without bleeding: Secondary | ICD-10-CM | POA: Diagnosis not present

## 2017-05-06 HISTORY — PX: COLONOSCOPY WITH PROPOFOL: SHX5780

## 2017-05-06 HISTORY — PX: POLYPECTOMY: SHX5525

## 2017-05-06 SURGERY — COLONOSCOPY WITH PROPOFOL
Anesthesia: Monitor Anesthesia Care

## 2017-05-06 MED ORDER — STERILE WATER FOR IRRIGATION IR SOLN
Status: DC | PRN
  Administered 2017-05-06: 100 mL

## 2017-05-06 MED ORDER — PROPOFOL 10 MG/ML IV BOLUS
INTRAVENOUS | Status: DC | PRN
Start: 2017-05-06 — End: 2017-05-06
  Administered 2017-05-06 (×2): 10 mg via INTRAVENOUS
  Administered 2017-05-06: 20 mg via INTRAVENOUS

## 2017-05-06 MED ORDER — CHLORHEXIDINE GLUCONATE CLOTH 2 % EX PADS: 6.0000 | MEDICATED_PAD | Freq: Once | CUTANEOUS | Status: DC

## 2017-05-06 MED ORDER — LACTATED RINGERS IV SOLN
INTRAVENOUS | Status: DC
  Administered 2017-05-06: 10:00:00 via INTRAVENOUS

## 2017-05-06 MED ORDER — EPHEDRINE SULFATE 50 MG/ML IJ SOLN
INTRAMUSCULAR | Status: DC | PRN
  Administered 2017-05-06: 5 mg via INTRAVENOUS

## 2017-05-06 MED ORDER — FENTANYL CITRATE (PF) 100 MCG/2ML IJ SOLN
INTRAMUSCULAR | Status: AC
  Filled 2017-05-06: qty 2

## 2017-05-06 MED ORDER — MIDAZOLAM HCL 2 MG/2ML IJ SOLN
1.0000 mg | INTRAMUSCULAR | Status: AC
  Administered 2017-05-06: 2 mg via INTRAVENOUS
  Filled 2017-05-06: qty 2

## 2017-05-06 MED ORDER — FENTANYL CITRATE (PF) 100 MCG/2ML IJ SOLN
25.0000 ug | Freq: Once | INTRAMUSCULAR | Status: AC
  Administered 2017-05-06: 25 ug via INTRAVENOUS

## 2017-05-06 MED ORDER — PROPOFOL 500 MG/50ML IV EMUL
INTRAVENOUS | Status: DC | PRN
  Administered 2017-05-06: 11:00:00 via INTRAVENOUS
  Administered 2017-05-06: 150 ug/kg/min via INTRAVENOUS

## 2017-05-06 NOTE — Anesthesia Postprocedure Evaluation (Signed)
Anesthesia Post Note  Patient: Reinhardt Licausi  Procedure(s) Performed: Procedure(s) (LRB): COLONOSCOPY WITH PROPOFOL (N/A) POLYPECTOMY  Patient location during evaluation: PACU Anesthesia Type: MAC Level of consciousness: awake and alert and oriented Pain management: pain level controlled Vital Signs Assessment: post-procedure vital signs reviewed and stable Respiratory status: spontaneous breathing Cardiovascular status: blood pressure returned to baseline Postop Assessment: no signs of nausea or vomiting Anesthetic complications: no     Last Vitals:  Vitals:   05/06/17 1005 05/06/17 1056  BP: 114/69 (!) 100/57  Pulse:  81  Resp: 18 18  Temp:  36.7 C    Last Pain:  Vitals:   05/06/17 0929  TempSrc: Oral                 Eliam Snapp

## 2017-05-06 NOTE — Anesthesia Preprocedure Evaluation (Signed)
Anesthesia Evaluation  Patient identified by MRN, date of birth, ID band Patient awake    Reviewed: Allergy & Precautions, NPO status , Patient's Chart, lab work & pertinent test results  Airway Mallampati: I  TM Distance: >3 FB Neck ROM: Full    Dental  (+) Teeth Intact   Pulmonary sleep apnea , former smoker,    breath sounds clear to auscultation       Cardiovascular hypertension, Pt. on medications  Rhythm:Regular Rate:Normal     Neuro/Psych    GI/Hepatic negative GI ROS, (+)     substance abuse  alcohol use,   Endo/Other  negative endocrine ROS  Renal/GU Renal InsufficiencyRenal disease     Musculoskeletal   Abdominal   Peds  Hematology negative hematology ROS (+)   Anesthesia Other Findings   Reproductive/Obstetrics                             Anesthesia Physical Anesthesia Plan  ASA: III  Anesthesia Plan: General and MAC   Post-op Pain Management:    Induction: Intravenous  PONV Risk Score and Plan:   Airway Management Planned: Simple Face Mask  Additional Equipment:   Intra-op Plan:   Post-operative Plan:   Informed Consent: I have reviewed the patients History and Physical, chart, labs and discussed the procedure including the risks, benefits and alternatives for the proposed anesthesia with the patient or authorized representative who has indicated his/her understanding and acceptance.     Plan Discussed with:   Anesthesia Plan Comments:         Anesthesia Quick Evaluation

## 2017-05-06 NOTE — Op Note (Signed)
[facility] Patient Name: Erik Gonzales Procedure Date: 05/06/2017 10:11 AM MRN: [id number] Date of Birth: June 04, 1946 Attending MD: Norvel Richards , MD CSN: [id number] Age: 71 Admit Type: Outpatient Procedure:                Colonoscopy Indications:              High risk colon cancer surveillance: Personal                            history of colonic polyps Providers:                Norvel Richards, MD, Rosina Lowenstein, RN, Randa Spike, Technician Referring MD:              Medicines:                Propofol per Anesthesia Complications:            No immediate complications. Estimated Blood Loss:     Estimated blood loss: none. Procedure:                Pre-Anesthesia Assessment:                           - Prior to the procedure, a History and Physical                            was performed, and patient medications and                            allergies were reviewed. The patient's tolerance of                            previous anesthesia was also reviewed. The risks                            and benefits of the procedure and the sedation                            options and risks were discussed with the patient.                            All questions were answered, and informed consent                            was obtained. Prior Anticoagulants: The patient has                            taken no previous anticoagulant or antiplatelet                            agents. ASA Grade Assessment: II - A patient with  mild systemic disease. After reviewing the risks                            and benefits, the patient was deemed in                            satisfactory condition to undergo the procedure.                           After obtaining informed consent, the colonoscope                            was passed under direct vision. Throughout the                            procedure, the patient's  blood pressure, pulse, and                            oxygen saturations were monitored continuously. The                            Colonoscope was introduced through the and advanced                            to the the cecum, identified by appendiceal orifice                            and ileocecal valve. The ileocecal valve,                            appendiceal orifice, and rectum were photographed.                            The quality of the bowel preparation was adequate. Scope In: 10:32:40 AM Scope Out: 10:48:04 AM Scope Withdrawal Time: 0 hours 10 minutes 10 seconds  Total Procedure Duration: 0 hours 15 minutes 24 seconds  Findings:      The perianal and digital rectal examinations were normal.      Scattered small and large-mouthed diverticula were found in the sigmoid       colon and descending colon.      A 12 mm polyp was found in the recto-sigmoid colon. The polyp was       semi-pedunculated. The polyp was removed with a hot snare. Resection and       retrieval were complete. Estimated blood loss: none.      The exam was otherwise without abnormality on direct and retroflexion       views. Impression:               - Diverticulosis in the sigmoid colon and in the                            descending colon.                           - One 12 mm polyp at  the recto-sigmoid colon,                            removed with a hot snare. Resected and retrieved.                           - The examination was otherwise normal on direct                            and retroflexion views. Moderate Sedation:      Moderate (conscious) sedation was personally administered by an       anesthesia professional. The following parameters were monitored: oxygen       saturation, heart rate, blood pressure, respiratory rate, EKG, adequacy       of pulmonary ventilation, and response to care. Total physician       intraservice time was 14 minutes. Recommendation:           - Patient has a  contact number available for                            emergencies. The signs and symptoms of potential                            delayed complications were discussed with the                            patient. Return to normal activities tomorrow.                            Written discharge instructions were provided to the                            patient.                           - Resume previous diet.                           - Continue present medications.                           - Repeat colonoscopy date to be determined after                            pending pathology results are reviewed for                            surveillance based on pathology results.                           - Return to GI clinic (date not yet determined). 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 All rights reserved. The codes documented in this report are preliminary and upon coder review may  be revised to meet current compliance requirements. Cristopher Estimable. Lua Feng, MD Norvel Richards, MD 05/06/2017 10:54:50 AM This report has been signed electronically. Number of Addenda: 0

## 2017-05-06 NOTE — Progress Notes (Signed)
History of chronic kidney disease. Unknown baseline creatinine. Procedure was today. Mild normocytic anemia. Please copy to PCP. Eric to provide further recommendations when returning.

## 2017-05-06 NOTE — Discharge Instructions (Addendum)
Colonoscopy Discharge Instructions  Read the instructions outlined below and refer to this sheet in the next few weeks. These discharge instructions provide you with general information on caring for yourself after you leave the hospital. Your doctor may also give you specific instructions. While your treatment has been planned according to the most current medical practices available, unavoidable complications occasionally occur. If you have any problems or questions after discharge, call Dr. Gala Romney at [phone]. ACTIVITY  You may resume your regular activity, but move at a slower pace for the next 24 hours.   Take frequent rest periods for the next 24 hours.   Walking will help get rid of the air and reduce the bloated feeling in your belly (abdomen).   No driving for 24 hours (because of the medicine (anesthesia) used during the test).    Do not sign any important legal documents or operate any machinery for 24 hours (because of the anesthesia used during the test).  NUTRITION  Drink plenty of fluids.   You may resume your normal diet as instructed by your doctor.   Begin with a light meal and progress to your normal diet. Heavy or fried foods are harder to digest and may make you feel sick to your stomach (nauseated).   Avoid alcoholic beverages for 24 hours or as instructed.  MEDICATIONS  You may resume your normal medications unless your doctor tells you otherwise.  WHAT YOU CAN EXPECT TODAY  Some feelings of bloating in the abdomen.   Passage of more gas than usual.   Spotting of blood in your stool or on the toilet paper.  IF YOU HAD POLYPS REMOVED DURING THE COLONOSCOPY:  No aspirin products for 7 days or as instructed.   No alcohol for 7 days or as instructed.   Eat a soft diet for the next 24 hours.  FINDING OUT THE RESULTS OF YOUR TEST Not all test results are available during your visit. If your test results are not back during the visit, make an appointment  with your caregiver to find out the results. Do not assume everything is normal if you have not heard from your caregiver or the medical facility. It is important for you to follow up on all of your test results.  SEEK IMMEDIATE MEDICAL ATTENTION IF:  You have more than a spotting of blood in your stool.   Your belly is swollen (abdominal distention).   You are nauseated or vomiting.   You have a temperature over 101.   You have abdominal pain or discomfort that is severe or gets worse throughout the day.     Colon diverticulosis and polyp information provided  Further recommendations to follow pending review of pathology report    Diverticulosis Diverticulosis is a condition that develops when small pouches (diverticula) form in the wall of the large intestine (colon). The colon is where water is absorbed and stool is formed. The pouches form when the inside layer of the colon pushes through weak spots in the outer layers of the colon. You may have a few pouches or many of them. What are the causes? The cause of this condition is not known. What increases the risk? The following factors may make you more likely to develop this condition:  Being older than age 41. Your risk for this condition increases with age. Diverticulosis is rare among people younger than age 67. By age 91, many people have it.  Eating a low-fiber diet.  Having frequent constipation.  Being overweight.  Not getting enough exercise.  Smoking.  Taking over-the-counter pain medicines, like aspirin and ibuprofen.  Having a family history of diverticulosis.  What are the signs or symptoms? In most people, there are no symptoms of this condition. If you do have symptoms, they may include:  Bloating.  Cramps in the abdomen.  Constipation or diarrhea.  Pain in the lower left side of the abdomen.  How is this diagnosed? This condition is most often diagnosed during an exam for other colon problems.  Because diverticulosis usually has no symptoms, it often cannot be diagnosed independently. This condition may be diagnosed by:  Using a flexible scope to examine the colon (colonoscopy).  Taking an X-ray of the colon after dye has been put into the colon (barium enema).  Doing a CT scan.  How is this treated? You may not need treatment for this condition if you have never developed an infection related to diverticulosis. If you have had an infection before, treatment may include:  Eating a high-fiber diet. This may include eating more fruits, vegetables, and grains.  Taking a fiber supplement.  Taking a live bacteria supplement (probiotic).  Taking medicine to relax your colon.  Taking antibiotic medicines.  Follow these instructions at home:  Drink 6-8 glasses of water or more each day to prevent constipation.  Try not to strain when you have a bowel movement.  If you have had an infection before: ? Eat more fiber as directed by your health care provider or your diet and nutrition specialist (dietitian). ? Take a fiber supplement or probiotic, if your health care provider approves.  Take over-the-counter and prescription medicines only as told by your health care provider.  If you were prescribed an antibiotic, take it as told by your health care provider. Do not stop taking the antibiotic even if you start to feel better.  Keep all follow-up visits as told by your health care provider. This is important. Contact a health care provider if:  You have pain in your abdomen.  You have bloating.  You have cramps.  You have not had a bowel movement in 3 days. Get help right away if:  Your pain gets worse.  Your bloating becomes very bad.  You have a fever or chills, and your symptoms suddenly get worse.  You vomit.  You have bowel movements that are bloody or black.  You have bleeding from your rectum. Summary  Diverticulosis is a condition that develops when  small pouches (diverticula) form in the wall of the large intestine (colon).  You may have a few pouches or many of them.  This condition is most often diagnosed during an exam for other colon problems.  If you have had an infection related to diverticulosis, treatment may include increasing the fiber in your diet, taking supplements, or taking medicines. This information is not intended to replace advice given to you by your health care provider. Make sure you discuss any questions you have with your health care provider. Document Released: 07/12/2004 Document Revised: 09/03/2016 Document Reviewed: 09/03/2016 Elsevier Interactive Patient Education  2017 [place].    Colon Polyps Polyps are tissue growths inside the body. Polyps can grow in many places, including the large intestine (colon). A polyp may be a round bump or a mushroom-shaped growth. You could have one polyp or several. Most colon polyps are noncancerous (benign). However, some colon polyps can become cancerous over time. What are the causes? The exact cause of colon  polyps is not known. What increases the risk? This condition is more likely to develop in people who:  Have a family history of colon cancer or colon polyps.  Are older than 66 or older than 45 if they are African American.  Have inflammatory bowel disease, such as ulcerative colitis or Crohn disease.  Are overweight.  Smoke cigarettes.  Do not get enough exercise.  Drink too much alcohol.  Eat a diet that is: ? High in fat and red meat. ? Low in fiber.  Had childhood cancer that was treated with abdominal radiation.  What are the signs or symptoms? Most polyps do not cause symptoms. If you have symptoms, they may include:  Blood coming from your rectum when having a bowel movement.  Blood in your stool.The stool may look dark red or black.  A change in bowel habits, such as constipation or diarrhea.  How is this diagnosed? This  condition is diagnosed with a colonoscopy. This is a procedure that uses a lighted, flexible scope to look at the inside of your colon. How is this treated? Treatment for this condition involves removing any polyps that are found. Those polyps will then be tested for cancer. If cancer is found, your health care provider will talk to you about options for colon cancer treatment. Follow these instructions at home: Diet  Eat plenty of fiber, such as fruits, vegetables, and whole grains.  Eat foods that are high in calcium and vitamin D, such as milk, cheese, yogurt, eggs, liver, fish, and broccoli.  Limit foods high in fat, red meats, and processed meats, such as hot dogs, sausage, bacon, and lunch meats.  Maintain a healthy weight, or lose weight if recommended by your health care provider. General instructions  Do not smoke cigarettes.  Do not drink alcohol excessively.  Keep all follow-up visits as told by your health care provider. This is important. This includes keeping regularly scheduled colonoscopies. Talk to your health care provider about when you need a colonoscopy.  Exercise every day or as told by your health care provider. Contact a health care provider if:  You have new or worsening bleeding during a bowel movement.  You have new or increased blood in your stool.  You have a change in bowel habits.  You unexpectedly lose weight. This information is not intended to replace advice given to you by your health care provider. Make sure you discuss any questions you have with your health care provider. Document Released: 07/11/2004 Document Revised: 03/22/2016 Document Reviewed: 09/05/2015 Elsevier Interactive Patient Education  [employer].

## 2017-05-06 NOTE — H&P (Signed)
@LOGO @   Primary Care Physician:  Earney Mallet, MD Primary Gastroenterologist:  Dr. Gala Romney  Pre-Procedure History & Physical: HPI:  Erik Gonzales is a 71 y.o. male here for surveillance colonoscopy. History of advanced adenomas removed in [place] 2009; negative colonoscopy 2010. No follow-up since that time. No bowel symptoms.  Past Medical History:  Diagnosis Date  . Back pain   . Benign essential hypertension   . Fasting hyperglycemia   . Gout   . History of kidney stones   . Hyperlipidemia   . Kidney disease   . Obstructive sleep apnea   . Overweight (BMI 25.0-29.9)   . Stage 3 chronic kidney disease     Past Surgical History:  Procedure Laterality Date  . kidney stone removal through back    . parathyroid surgery    . TOE SURGERY     right great toe  . TONSILECTOMY, ADENOIDECTOMY, BILATERAL MYRINGOTOMY AND TUBES      Prior to Admission medications   Medication Sig Start Date End Date Taking? Authorizing Provider  acetaminophen (TYLENOL) 500 MG tablet Take 500 mg by mouth every 6 (six) hours as needed (for pain/headache.).   Yes [provider]  amLODipine-benazepril (LOTREL) 10-20 MG capsule Take 1 capsule by mouth daily.  01/22/17  Yes [provider]  atorvastatin (LIPITOR) 40 MG tablet Take 40 mg by mouth at bedtime.  01/22/17  Yes [provider]  Cholecalciferol (VITAMIN D3) 1000 units CAPS Take 1,000 Units by mouth daily.   Yes [provider]  Multiple Vitamin (MULTIVITAMIN WITH MINERALS) TABS tablet Take 1 tablet by mouth daily.   Yes [provider]  polyethylene glycol-electrolytes (TRILYTE) 420 g solution Take 4,000 mLs by mouth as directed. 03/14/17  Yes Shakeela Rabadan, Cristopher Estimable, MD  ULORIC 40 MG tablet Take 40 mg by mouth daily.    Yes [provider]    Allergies as of 03/14/2017  . (No Known Allergies)    Family History  Problem Relation Age of Onset  . Colon cancer Neg Hx   . Colon polyps Neg Hx      Social History   Social History  . Marital status: Married    Spouse name: N/A  . Number of children: N/A  . Years of education: N/A   Occupational History  . Not on file.   Social History Main Topics  . Smoking status: Former Smoker    Quit date: 05/02/1976  . Smokeless tobacco: Never Used  . Alcohol use Yes     Comment: 2 drink a day  . Drug use: No  . Sexual activity: Not on file   Other Topics Concern  . Not on file   Social History Narrative  . No narrative on file    Review of Systems: See HPI, otherwise negative ROS  Physical Exam: BP (!) 167/85   Pulse 86   Temp 97.7 F (36.5 C) (Oral)   Resp 18   SpO2 99%  General:   Alert,  Well-developed, well-nourished, pleasant and cooperative in NAD Skin:  Intact without significant lesions or rashes. Neck:  Supple; no masses or thyromegaly. No significant cervical adenopathy. Lungs:  Clear throughout to auscultation.   No wheezes, crackles, or rhonchi. No acute distress. Heart:  Regular rate and rhythm; no murmurs, clicks, rubs,  or gallops. Abdomen: Non-distended, normal bowel sounds.  Soft and nontender without appreciable mass or hepatosplenomegaly.  Pulses:  Normal pulses noted. Extremities:  Without clubbing or edema.  Impression:  Pleasant 71 year old  gentleman with a history of colonic adenoma. Here for surveillance colonoscopy.  Recommendations:  I have offered the patient a surveillance colonoscopy today under deep sedation with propofol.  The risks, benefits, limitations, alternatives and imponderables have been reviewed with the patient. Questions have been answered. All parties are agreeable.      Notice: This dictation was prepared with Dragon dictation along with smaller phrase technology. Any transcriptional errors that result from this process are unintentional and may not be corrected upon review.

## 2017-05-06 NOTE — Transfer of Care (Signed)
Immediate Anesthesia Transfer of Care Note  Patient: Erik Gonzales  Procedure(s) Performed: Procedure(s) with comments: COLONOSCOPY WITH PROPOFOL (N/A) - 10:30am POLYPECTOMY - recto sigmoid colon polyp  Patient Location: PACU  Anesthesia Type:MAC  Level of Consciousness: awake  Airway & Oxygen Therapy: Patient Spontanous Breathing  Post-op Assessment: Report given to RN  Post vital signs: Reviewed and stable  Last Vitals:  Vitals:   05/06/17 1000 05/06/17 1005  BP: 108/69 114/69  Pulse:    Resp: 16 18  Temp:      Last Pain:  Vitals:   05/06/17 0929  TempSrc: Oral         Complications: No apparent anesthesia complications

## 2017-05-07 ENCOUNTER — Encounter: Payer: Self-pay | Admitting: Internal Medicine

## 2017-05-09 ENCOUNTER — Encounter (HOSPITAL_COMMUNITY): Payer: Self-pay | Admitting: Internal Medicine

## 2017-05-24 ENCOUNTER — Other Ambulatory Visit: Payer: Self-pay | Admitting: Nurse Practitioner

## 2017-05-24 ENCOUNTER — Other Ambulatory Visit: Payer: Self-pay

## 2017-05-24 DIAGNOSIS — D649 Anemia, unspecified: Secondary | ICD-10-CM

## 2017-05-30 ENCOUNTER — Other Ambulatory Visit: Payer: Self-pay | Admitting: Nurse Practitioner

## 2017-05-31 LAB — IRON AND TIBC
IRON SATURATION: 32 % (ref 15–55)
Iron: 95 ug/dL (ref 38–169)
TIBC: 300 ug/dL (ref 250–450)
UIBC: 205 ug/dL (ref 111–343)

## 2017-05-31 LAB — FERRITIN: Ferritin: 125 ng/mL (ref 30–400)

## 2017-06-04 ENCOUNTER — Ambulatory Visit (INDEPENDENT_AMBULATORY_CARE_PROVIDER_SITE_OTHER): Payer: Medicare Other | Admitting: Gastroenterology

## 2017-06-04 DIAGNOSIS — D649 Anemia, unspecified: Secondary | ICD-10-CM | POA: Diagnosis not present

## 2017-06-06 LAB — IFOBT (OCCULT BLOOD): IMMUNOLOGICAL FECAL OCCULT BLOOD TEST: NEGATIVE

## 2017-06-06 NOTE — Progress Notes (Signed)
Heme negative. I am not sure why this was done. Eric for further recommendations.

## 2020-03-23 ENCOUNTER — Encounter: Payer: Self-pay | Admitting: Internal Medicine
